# Patient Record
Sex: Male | Born: 1985 | Race: Black or African American | Hispanic: No | Marital: Single | Smoking: Current every day smoker
Health system: Southern US, Community
[De-identification: ages and names within clinical notes are randomized; demographics above are authoritative.]

---

## 2015-10-08 ENCOUNTER — Emergency Department (INDEPENDENT_AMBULATORY_CARE_PROVIDER_SITE_OTHER)
Admission: EM | Admit: 2015-10-08 | Discharge: 2015-10-08 | Disposition: A | Discharge status: Home / Self Care | Payer: Medicaid - Out of State | Source: Home / Self Care | Attending: Emergency Medicine | Admitting: Emergency Medicine

## 2015-10-08 ENCOUNTER — Encounter (HOSPITAL_COMMUNITY): Payer: Self-pay | Admitting: Emergency Medicine

## 2015-10-08 DIAGNOSIS — R030 Elevated blood-pressure reading, without diagnosis of hypertension: Secondary | ICD-10-CM

## 2015-10-08 DIAGNOSIS — IMO0001 Reserved for inherently not codable concepts without codable children: Secondary | ICD-10-CM

## 2015-10-08 NOTE — ED Notes (Signed)
Pt has recorded HBP from the plasma center over the last month.  Pt reports no significant medical or family history.

## 2015-10-08 NOTE — Discharge Instructions (Signed)
Hypertension Hypertension, commonly called high blood pressure, is when the force of blood pumping through your arteries is too strong. Your arteries are the blood vessels that carry blood from your heart throughout your body. A blood pressure reading consists of a higher number over a lower number, such as 110/72. The higher number (systolic) is the pressure inside your arteries when your heart pumps. The lower number (diastolic) is the pressure inside your arteries when your heart relaxes. Ideally you want your blood pressure below 120/80. Hypertension forces your heart to work harder to pump blood. Your arteries may become narrow or stiff. Having untreated or uncontrolled hypertension can cause heart attack, stroke, kidney disease, and other problems. RISK FACTORS Some risk factors for high blood pressure are controllable. Others are not.  Risk factors you cannot control include:   Race. You may be at higher risk if you are African American.  Age. Risk increases with age.  Gender. Men are at higher risk than women before age 45 years. After age 65, women are at higher risk than men. Risk factors you can control include:  Not getting enough exercise or physical activity.  Being overweight.  Getting too much fat, sugar, calories, or salt in your diet.  Drinking too much alcohol. SIGNS AND SYMPTOMS Hypertension does not usually cause signs or symptoms. Extremely high blood pressure (hypertensive crisis) may cause headache, anxiety, shortness of breath, and nosebleed. DIAGNOSIS To check if you have hypertension, your health care provider will measure your blood pressure while you are seated, with your arm held at the level of your heart. It should be measured at least twice using the same arm. Certain conditions can cause a difference in blood pressure between your right and left arms. A blood pressure reading that is higher than normal on one occasion does not mean that you need treatment. If  it is not clear whether you have high blood pressure, you may be asked to return on a different day to have your blood pressure checked again. Or, you may be asked to monitor your blood pressure at home for 1 or more weeks. TREATMENT Treating high blood pressure includes making lifestyle changes and possibly taking medicine. Living a healthy lifestyle can help lower high blood pressure. You may need to change some of your habits. Lifestyle changes may include:  Following the DASH diet. This diet is high in fruits, vegetables, and whole grains. It is low in salt, red meat, and added sugars.  Keep your sodium intake below 2,300 mg per day.  Getting at least 30-45 minutes of aerobic exercise at least 4 times per week.  Losing weight if necessary.  Not smoking.  Limiting alcoholic beverages.  Learning ways to reduce stress. Your health care provider may prescribe medicine if lifestyle changes are not enough to get your blood pressure under control, and if one of the following is true:  You are 18-59 years of age and your systolic blood pressure is above 140.  You are 60 years of age or older, and your systolic blood pressure is above 150.  Your diastolic blood pressure is above 90.  You have diabetes, and your systolic blood pressure is over 140 or your diastolic blood pressure is over 90.  You have kidney disease and your blood pressure is above 140/90.  You have heart disease and your blood pressure is above 140/90. Your personal target blood pressure may vary depending on your medical conditions, your age, and other factors. HOME CARE INSTRUCTIONS    Have your blood pressure rechecked as directed by your health care provider.   Take medicines only as directed by your health care provider. Follow the directions carefully. Blood pressure medicines must be taken as prescribed. The medicine does not work as well when you skip doses. Skipping doses also puts you at risk for  problems.  Do not smoke.   Monitor your blood pressure at home as directed by your health care provider. SEEK MEDICAL CARE IF:   You think you are having a reaction to medicines taken.  You have recurrent headaches or feel dizzy.  You have swelling in your ankles.  You have trouble with your vision. SEEK IMMEDIATE MEDICAL CARE IF:  You develop a severe headache or confusion.  You have unusual weakness, numbness, or feel faint.  You have severe chest or abdominal pain.  You vomit repeatedly.  You have trouble breathing. MAKE SURE YOU:   Understand these instructions.  Will watch your condition.  Will get help right away if you are not doing well or get worse.   This information is not intended to replace advice given to you by your health care provider. Make sure you discuss any questions you have with your health care provider.   Document Released: 07/26/2005 Document Revised: 12/10/2014 Document Reviewed: 05/18/2013 Elsevier Interactive Patient Education 2016 Else[employer] Inc[employer] Managing Your High Blood Pressure Blood pressure is a measurement of how forceful your blood is pressing against the walls of the arteries. Arteries are muscular tubes within the circulatory system. Blood pressure does not stay the same. Blood pressure rises when you are active, excited, or nervous; and it lowers during sleep and relaxation. If the numbers measuring your blood pressure stay above normal most of the time, you are at risk for health problems. High blood pressure (hypertension) is a long-term (chronic) condition in which blood pressure is elevated. A blood pressure reading is recorded as two numbers, such as 120 over 80 (or 120/80). The first, higher number is called the systolic pressure. It is a measure of the pressure in your arteries as the heart beats. The second, lower number is called the diastolic pressure. It is a measure of the pressure in your arteries as the heart relaxes between  beats.  Keeping your blood pressure in a normal range is important to your overall health and prevention of health problems, such as heart disease and stroke. When your blood pressure is uncontrolled, your heart has to work harder than normal. High blood pressure is a very common condition in adults because blood pressure tends to rise with age. Men and women are equally likely to have hypertension but at different times in life. Before age 45, men are more likely to have hypertension. After 32 years of age, women are more likely to have it. Hypertension is especially common in African Americans. This condition often has no signs or symptoms. The cause of the condition is usually not known. Your caregiver can help you come up with a plan to keep your blood pressure in a normal, healthy range. BLOOD PRESSURE STAGES Blood pressure is classified into four stages: normal, prehypertension, stage 1, and stage 2. Your blood pressure reading will be used to determine what type of treatment, if any, is necessary. Appropriate treatment options are tied to these four stages:  Normal  Systolic pressure (mm Hg): below 120.  Diastolic pressure (mm Hg): below 80. Prehypertension  Systolic pressure (mm Hg): 120 to 139.  Diastolic pressure (mm Hg): 80 to   89. Stage1  Systolic pressure (mm Hg): 140 to 159.  Diastolic pressure (mm Hg): 90 to 99. Stage2  Systolic pressure (mm Hg): 160 or above.  Diastolic pressure (mm Hg): 100 or above. RISKS RELATED TO HIGH BLOOD PRESSURE Managing your blood pressure is an important responsibility. Uncontrolled high blood pressure can lead to:  A heart attack.  A stroke.  A weakened blood vessel (aneurysm).  Heart failure.  Kidney damage.  Eye damage.  Metabolic syndrome.  Memory and concentration problems. HOW TO MANAGE YOUR BLOOD PRESSURE Blood pressure can be managed effectively with lifestyle changes and medicines (if needed). Your caregiver will help  you come up with a plan to bring your blood pressure within a normal range. Your plan should include the following: Education  Read all information provided by your caregivers about how to control blood pressure.  Educate yourself on the latest guidelines and treatment recommendations. New research is always being done to further define the risks and treatments for high blood pressure. Lifestylechanges  Control your weight.  Avoid smoking.  Stay physically active.  Reduce the amount of salt in your diet.  Reduce stress.  Control any chronic conditions, such as high cholesterol or diabetes.  Reduce your alcohol intake. Medicines  Several medicines (antihypertensive medicines) are available, if needed, to bring blood pressure within a normal range. Communication  Review all the medicines you take with your caregiver because there may be side effects or interactions.  Talk with your caregiver about your diet, exercise habits, and other lifestyle factors that may be contributing to high blood pressure.  See your caregiver regularly. Your caregiver can help you create and adjust your plan for managing high blood pressure. RECOMMENDATIONS FOR TREATMENT AND FOLLOW-UP  The following recommendations are based on current guidelines for managing high blood pressure in nonpregnant adults. Use these recommendations to identify the proper follow-up period or treatment option based on your blood pressure reading. You can discuss these options with your caregiver.  Systolic pressure of 120 to 139 or diastolic pressure of 80 to 89: Follow up with your caregiver as directed.  Systolic pressure of 140 to 160 or diastolic pressure of 90 to 100: Follow up with your caregiver within 2 months.  Systolic pressure above 160 or diastolic pressure above 100: Follow up with your caregiver within 1 month.  Systolic pressure above 180 or diastolic pressure above 110: Consider antihypertensive therapy;  follow up with your caregiver within 1 week.  Systolic pressure above 200 or diastolic pressure above 120: Begin antihypertensive therapy; follow up with your caregiver within 1 week.   This information is not intended to replace advice given to you by your health care provider. Make sure you discuss any questions you have with your health care provider.   Document Released: 04/19/2012 Document Reviewed: 04/19/2012 Elsevier Interactive Patient Education 2016 Elsevier Inc.  

## 2015-10-08 NOTE — ED Provider Notes (Signed)
CSN: [id number]     Arrival date & time 10/08/15  1315 History   First MD Initiated Contact with Patient 10/08/15 1442     Chief Complaint  Patient presents with  . Hypertension   (Consider location/radiation/quality/duration/timing/severity/associated sxs/prior Treatment) HPI Comments: 30 year old male presents to the urgent care for evaluation and management of hypertension. He apparently sells plasma for money and when he had his blood pressure readings taken on 2 separate dates his blood pressure were elevated. The readings given were 172/104, 142/104, 166/116 and 158/110. Patient states he is completely asymptomatic. His review of systems is negative. His current blood pressure in the urgent care is 141/95.   History reviewed. No pertinent past medical history. History reviewed. No pertinent past surgical history. History reviewed. No pertinent family history. Social History  Substance Use Topics  . Smoking status: Current Every Day Smoker -- 0.50 packs/day    Types: Cigarettes  . Smokeless tobacco: None  . Alcohol Use: Yes     Comment: occasional    Review of Systems  Constitutional: Negative.   HENT: Negative.   Respiratory: Negative.  Negative for cough and shortness of breath.   Cardiovascular: Negative for chest pain and leg swelling.  Gastrointestinal: Negative.   Musculoskeletal: Negative.   Skin: Negative.   Neurological: Negative.     Allergies  Review of patient's allergies indicates no known allergies.  Home Medications   Prior to Admission medications   Not on File   Meds Ordered and Administered this Visit  Medications - No data to display  BP 141/95 mmHg  Pulse 72  Temp(Src) 98.5 F (36.9 C) (Oral)  Resp 18  SpO2 99% No data found.   Physical Exam  Constitutional: He is oriented to person, place, and time. He appears well-developed and well-nourished. No distress.  Eyes: EOM are normal.  Neck: Normal range of motion. Neck supple.   Cardiovascular: Normal rate, regular rhythm and normal heart sounds.   Pulmonary/Chest: Effort normal and breath sounds normal. No respiratory distress. He has no wheezes.  Musculoskeletal: He exhibits no edema.  Neurological: He is alert and oriented to person, place, and time. He exhibits normal muscle tone.  Skin: Skin is warm and dry.  Psychiatric: He has a normal mood and affect.  Nursing note and vitals reviewed.   ED Course  Procedures (including critical care time)  Labs Review Labs Reviewed - No data to display  Imaging Review No results found.   Visual Acuity Review  Right Eye Distance:   Left Eye Distance:   Bilateral Distance:    Right Eye Near:   Left Eye Near:    Bilateral Near:         MDM   1. Elevated blood pressure    Patient is instructed to follow-up with  [facility] or call [phone] for assistance. He was also advised that this particular urgent care is unable to provide primary care.  He is completely asymptomatic. His vital signs are normal with mild elevation in blood pressure today. He is advised to stop smoking.    Hayden Rasmussen, NP 10/08/15 1535

## 2015-10-26 ENCOUNTER — Encounter (HOSPITAL_COMMUNITY): Payer: Self-pay | Admitting: Emergency Medicine

## 2015-10-26 ENCOUNTER — Emergency Department (HOSPITAL_COMMUNITY)
Admission: EM | Admit: 2015-10-26 | Discharge: 2015-10-26 | Disposition: A | Discharge status: Home / Self Care | Payer: Medicaid - Out of State | Attending: Emergency Medicine | Admitting: Emergency Medicine

## 2015-10-26 DIAGNOSIS — Y998 Other external cause status: Secondary | ICD-10-CM | POA: Diagnosis not present

## 2015-10-26 DIAGNOSIS — Y9289 Other specified places as the place of occurrence of the external cause: Secondary | ICD-10-CM | POA: Diagnosis not present

## 2015-10-26 DIAGNOSIS — S61012A Laceration without foreign body of left thumb without damage to nail, initial encounter: Secondary | ICD-10-CM | POA: Diagnosis not present

## 2015-10-26 DIAGNOSIS — Y9389 Activity, other specified: Secondary | ICD-10-CM | POA: Insufficient documentation

## 2015-10-26 DIAGNOSIS — X58XXXA Exposure to other specified factors, initial encounter: Secondary | ICD-10-CM | POA: Insufficient documentation

## 2015-10-26 DIAGNOSIS — F17210 Nicotine dependence, cigarettes, uncomplicated: Secondary | ICD-10-CM | POA: Insufficient documentation

## 2015-10-26 MED ORDER — LIDOCAINE HCL (PF) 1 % IJ SOLN
5.0000 mL | Freq: Once | INTRAMUSCULAR | Status: AC
  Administered 2015-10-26: 5 mL
  Filled 2015-10-26: qty 5

## 2015-10-26 MED ORDER — IBUPROFEN 400 MG PO TABS
800.0000 mg | ORAL_TABLET | Freq: Once | ORAL | Status: AC
  Administered 2015-10-26: 800 mg via ORAL
  Filled 2015-10-26: qty 2

## 2015-10-26 NOTE — ED Notes (Signed)
Pt has lac to left thumb unknown cause onset possible last night. No bleeding noted.

## 2015-10-26 NOTE — Discharge Instructions (Signed)
Return to the ER for fever, stitches break/wound reopens, purulent wound drainage, redness, increasing pain. Return to the ER or go to any urgent care in 10-14 days for suture removal. Ibuprofen as needed for pain.  Sutured Wound Care Sutures are stitches that can be used to close wounds. Taking care of your wound properly can help to prevent pain and infection. It can also help your wound to heal more quickly. HOW TO CARE FOR YOUR SUTURED WOUND Wound Care  Keep the wound clean and dry.  If you were given a bandage (dressing), you should change it at least once per day or as directed by your health care provider. You should also change it if it becomes wet or dirty.  Keep the wound completely dry for the first 24 hours or as directed by your health care provider. After that time, you may shower or bathe. However, make sure that the wound is not soaked in water until the sutures have been removed.  Clean the wound one time each day or as directed by your health care provider.  Wash the wound with soap and water.  Rinse the wound with water to remove all soap.  Pat the wound dry with a clean towel. Do not rub the wound.  Aftercleaning the wound, apply a thin layer of antibioticointment as directed by your health care provider. This will help to prevent infection and keep the dressing from sticking to the wound.  Have the sutures removed as directed by your health care provider. General Instructions  Take or apply medicines only as directed by your health care provider.  To help prevent scarring, make sure to cover your wound with sunscreen whenever you are outside after the sutures are removed and the wound is healed. Make sure to wear a sunscreen of at least 30 SPF.  If you were prescribed an antibiotic medicine or ointment, finish all of it even if you start to feel better.  Do not scratch or pick at the wound.  Keep all follow-up visits as directed by your health care provider.  This is important.  Check your wound every day for signs of infection. Watch for:   Redness, swelling, or pain.  Fluid, blood, or pus.  Raise (elevate) the injured area above the level of your heart while you are sitting or lying down, if possible.  Avoid stretching your wound.  Drink enough fluids to keep your urine clear or pale yellow. SEEK MEDICAL CARE IF:  You received a tetanus shot and you have swelling, severe pain, redness, or bleeding at the injection site.  You have a fever.  A wound that was closed breaks open.  You notice a bad smell coming from the wound.  You notice something coming out of the wound, such as wood or glass.  Your pain is not controlled with medicine.  You have increased redness, swelling, or pain at the site of your wound.  You have fluid, blood, or pus coming from your wound.  You notice a change in the color of your skin near your wound.  You need to change the dressing frequently due to fluid, blood, or pus draining from the wound.  You develop a new rash.  You develop numbness around the wound. SEEK IMMEDIATE MEDICAL CARE IF:  You develop severe swelling around the injury site.  Your pain suddenly increases and is severe.  You develop painful lumps near the wound or on skin that is anywhere on your body.  You  have a red streak going away from your wound.  The wound is on your hand or foot and you cannot properly move a finger or toe.  The wound is on your hand or foot and you notice that your fingers or toes look pale or bluish.   This information is not intended to replace advice given to you by your health care provider. Make sure you discuss any questions you have with your health care provider.   Document Released: 09/02/2004 Document Revised: 12/10/2014 Document Reviewed: 03/07/2013 Elsevier Interactive Patient Education [employer]

## 2015-10-26 NOTE — ED Provider Notes (Addendum)
CSN: [id number]     Arrival date & time 10/26/15  1212 History  By signing my name below, I, Adam Parks, attest that this documentation has been prepared under the direction and in the presence of Adam Elison Y. Nthony Lefferts, PA-C Electronically Signed: Soijett Parks, ED Scribe. 10/26/2015. 1:02 PM.   Chief Complaint  Patient presents with  . Extremity Laceration     The history is provided by the patient. No language interpreter was used.    Adam Parks is a 30 y.o. male who presents to the Emergency Department complaining of left thumb laceration onset 10 PM last night. Pt is unsure of how he cut herself but notes that he was intoxicated at the time. He is unsure if he is UTD on his tetanus. He states that he is having associated symptoms of numbness to left thumb. He states that he has tried wound care with peroxide for the relief for his symptoms. He denies color change, swelling, tingling, and any other symptoms at this time.   History reviewed. No pertinent past medical history. History reviewed. No pertinent past surgical history. No family history on file. Social History  Substance Use Topics  . Smoking status: Current Every Day Smoker -- 0.50 packs/day    Types: Cigarettes  . Smokeless tobacco: None  . Alcohol Use: Yes     Comment: occasional    Review of Systems  Musculoskeletal: Positive for arthralgias. Negative for joint swelling.  Skin: Positive for wound. Negative for color change and rash.  All other systems reviewed and are negative.     Allergies  Review of patient's allergies indicates no known allergies.  Home Medications   Prior to Admission medications   Not on File   BP 128/85 mmHg  Pulse 95  Temp(Src) 98.7 F (37.1 C) (Oral)  Resp 18  Ht 5\' 6"  (1.676 m)  Wt 175 lb 8 oz (79.606 kg)  BMI 28.34 kg/m2  SpO2 99% Physical Exam  Constitutional: He is oriented to person, place, and time. He appears well-developed and well-nourished. No distress.  HENT:   Head: Normocephalic and atraumatic.  Eyes: EOM are normal.  Neck: Neck supple.  Cardiovascular: Normal rate.   Pulmonary/Chest: Effort normal. No respiratory distress.  Musculoskeletal: Normal range of motion.  Left thumb with 1.5 cm very superificial laceration that runs diagonally across 1st knuckle. FROM of finger. Brisk cap refill. Intact sensation and strength.   Neurological: He is alert and oriented to person, place, and time.  Skin: Skin is warm and dry.  Psychiatric: He has a normal mood and affect. His behavior is normal.  Nursing note and vitals reviewed.   ED Course  Procedures (including critical care time)  NERVE BLOCK Performed by: Noelle PennerSerena Kaina Orengo Consent: Verbal consent obtained. Required items: required blood products, implants, devices, and special equipment available Time out: Immediately prior to procedure a "time out" was called to verify the correct patient, procedure, equipment, support staff and site/side marked as required.  Indication: thumb lac Nerve block body site: left thumb  Preparation: Patient was prepped and draped in the usual sterile fashion. Needle gauge: 24 G Location technique: anatomical landmarks  Local anesthetic: 1% lido without epi  Anesthetic total: 4 ml  Outcome: pain improved Patient tolerance: Patient tolerated the procedure well with no immediate complications.  LACERATION REPAIR Performed by: Noelle PennerSerena Johnie Stadel Authorized by: Noelle PennerSerena Kennth Vanbenschoten Consent: Verbal consent obtained. Risks and benefits: risks, benefits and alternatives were discussed Consent given by: patient Patient identity confirmed: provided demographic data  Prepped and Draped in normal sterile fashion Wound explored  Laceration Location: left thumb  Laceration Length: 1.5cm  No Foreign Bodies seen or palpated  Anesthesia: local infiltration  Local anesthetic: lidocaine 1% without epinephrine  Anesthetic total: 4 ml  Irrigation method: syringe Amount of cleaning:  standard  Skin closure: 5-0 prolene   Number of sutures: 3  Technique: simple interrupted  Patient tolerance: Patient tolerated the procedure well with no immediate complications.   DIAGNOSTIC STUDIES: Oxygen Saturation is 99% on RA, nl by my interpretation.    COORDINATION OF CARE: 12:58 PM Discussed treatment plan with pt at bedside which includes laceration repair and pt agreed to plan.    Labs Review Labs Reviewed - No data to display  Imaging Review No results found.    EKG Interpretation None      MDM   Final diagnoses:  Laceration of left thumb, initial encounter    Pt tolerated lac repair well. He is declining x-ray today. Neurovascularly intact. Lac is superficial, and i do not see any bony involvement. Patient may be safely discharged home. Discussed reasons for return (fever, stitches break/wound reopens, purulent wound drainage, redness, increasing pain). Patient to follow-up with primary care provider within 10-14 days for suture removal. Patient in understanding and agreement with the plan.  Pt witnessed leaving the ED by staff member before receiving discharge paperwork.  I personally performed the services described in this documentation, which was scribed in my presence. The recorded information has been reviewed and is accurate.   Carlene Coria, PA-C 10/26/15 1406  Glynn Octave, MD 10/26/15 1516  Carlene Coria, PA-C 10/31/15 1256  Glynn Octave, MD 11/02/15 [phone]

## 2020-10-19 ENCOUNTER — Encounter: Payer: Self-pay | Admitting: Emergency Medicine

## 2020-10-19 ENCOUNTER — Emergency Department: Payer: Commercial Managed Care - PPO

## 2020-10-19 ENCOUNTER — Other Ambulatory Visit: Payer: Self-pay

## 2020-10-19 ENCOUNTER — Emergency Department
Admission: EM | Admit: 2020-10-19 | Discharge: 2020-10-19 | Disposition: A | Discharge status: Home / Self Care | Payer: Commercial Managed Care - PPO | Attending: Emergency Medicine | Admitting: Emergency Medicine

## 2020-10-19 DIAGNOSIS — Z20822 Contact with and (suspected) exposure to covid-19: Secondary | ICD-10-CM | POA: Diagnosis not present

## 2020-10-19 DIAGNOSIS — E860 Dehydration: Secondary | ICD-10-CM | POA: Insufficient documentation

## 2020-10-19 DIAGNOSIS — J101 Influenza due to other identified influenza virus with other respiratory manifestations: Secondary | ICD-10-CM | POA: Diagnosis not present

## 2020-10-19 DIAGNOSIS — R0781 Pleurodynia: Secondary | ICD-10-CM | POA: Insufficient documentation

## 2020-10-19 DIAGNOSIS — R059 Cough, unspecified: Secondary | ICD-10-CM | POA: Diagnosis present

## 2020-10-19 DIAGNOSIS — F17210 Nicotine dependence, cigarettes, uncomplicated: Secondary | ICD-10-CM | POA: Insufficient documentation

## 2020-10-19 LAB — COMPREHENSIVE METABOLIC PANEL
ALT: 55 U/L — ABNORMAL HIGH (ref 0–44)
AST: 49 U/L — ABNORMAL HIGH (ref 15–41)
Albumin: 4.3 g/dL (ref 3.5–5.0)
Alkaline Phosphatase: 62 U/L (ref 38–126)
Anion gap: 11 (ref 5–15)
BUN: 15 mg/dL (ref 6–20)
CO2: 26 mmol/L (ref 22–32)
Calcium: 9.1 mg/dL (ref 8.9–10.3)
Chloride: 97 mmol/L — ABNORMAL LOW (ref 98–111)
Creatinine, Ser: 1.28 mg/dL — ABNORMAL HIGH (ref 0.61–1.24)
GFR, Estimated: >60 mL/min (ref >60)
Glucose, Bld: 102 mg/dL — ABNORMAL HIGH (ref 70–99)
Potassium: 3.2 mmol/L — ABNORMAL LOW (ref 3.5–5.1)
Sodium: 134 mmol/L — ABNORMAL LOW (ref 135–145)
Total Bilirubin: 0.6 mg/dL (ref 0.3–1.2)
Total Protein: 7.7 g/dL (ref 6.5–8.1)

## 2020-10-19 LAB — D-DIMER, QUANTITATIVE: D-Dimer, Quant: 0.68 ug/mL-FEU — ABNORMAL HIGH (ref 0.00–0.50)

## 2020-10-19 LAB — CBC WITH DIFFERENTIAL/PLATELET
Abs Immature Granulocytes: 0.03 10*3/uL (ref 0.00–0.07)
Basophils Absolute: 0 10*3/uL (ref 0.0–0.1)
Basophils Relative: 1 %
Eosinophils Absolute: 0 10*3/uL (ref 0.0–0.5)
Eosinophils Relative: 0 %
HCT: 43.8 % (ref 39.0–52.0)
Hemoglobin: 14.7 g/dL (ref 13.0–17.0)
Immature Granulocytes: 1 %
Lymphocytes Relative: 14 %
Lymphs Abs: 0.8 10*3/uL (ref 0.7–4.0)
MCH: 30.8 pg (ref 26.0–34.0)
MCHC: 33.6 g/dL (ref 30.0–36.0)
MCV: 91.6 fL (ref 80.0–100.0)
Monocytes Absolute: 0.4 10*3/uL (ref 0.1–1.0)
Monocytes Relative: 7 %
Neutro Abs: 4.7 10*3/uL (ref 1.7–7.7)
Neutrophils Relative %: 77 %
Platelets: 310 10*3/uL (ref 150–400)
RBC: 4.78 MIL/uL (ref 4.22–5.81)
RDW: 12.5 % (ref 11.5–15.5)
WBC: 6 10*3/uL (ref 4.0–10.5)
nRBC: 0 % (ref 0.0–0.2)

## 2020-10-19 LAB — TROPONIN I (HIGH SENSITIVITY)
Troponin I (High Sensitivity): 15 ng/L (ref <18)
Troponin I (High Sensitivity): 15 ng/L (ref <18)

## 2020-10-19 LAB — RESP PANEL BY RT-PCR (FLU A&B, COVID) ARPGX2
Influenza A by PCR: POSITIVE — AB
Influenza B by PCR: NEGATIVE
SARS Coronavirus 2 by RT PCR: NEGATIVE

## 2020-10-19 IMAGING — CT CT ANGIO CHEST
2 of 6 series · 19 of 46 positions shown · IV contrast (APPLIED)
Comparison: None.

CLINICAL DATA: Cough and fever.

EXAM:
CT ANGIOGRAPHY CHEST WITH CONTRAST
TECHNIQUE: Multidetector CT imaging of the chest was performed using the
standard protocol during bolus administration of intravenous
contrast. Multiplanar CT image reconstructions and MIPs were
obtained to evaluate the vascular anatomy.
CONTRAST:  75mL OMNIPAQUE IOHEXOL 350 MG/ML SOLN

[Series 5: thins · axial · 0.62mm/px · z∈[-219,+66]mm · 16 of 313 slices shown]
[im 14/313  lung]
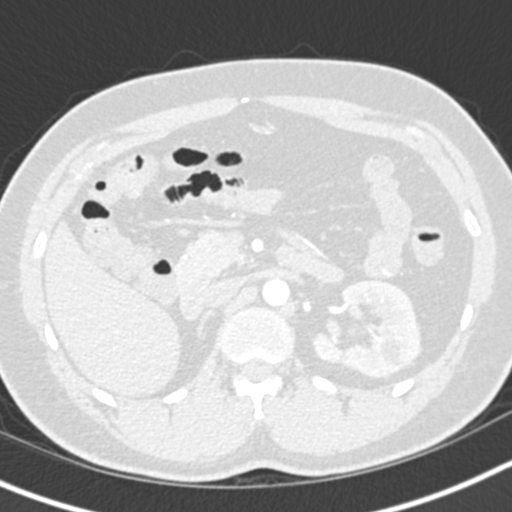
[im 41/313  soft-tissue]
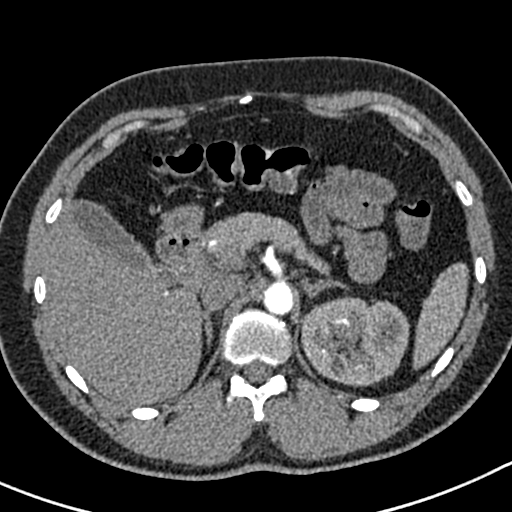
[im 55/313  lung]
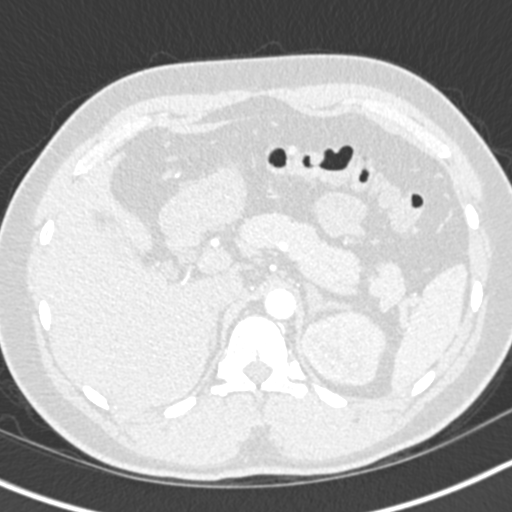
[im 68/313  soft-tissue]
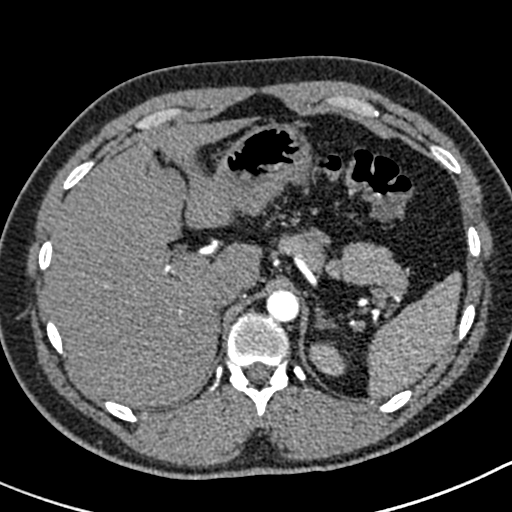
[im 95/313  lung]
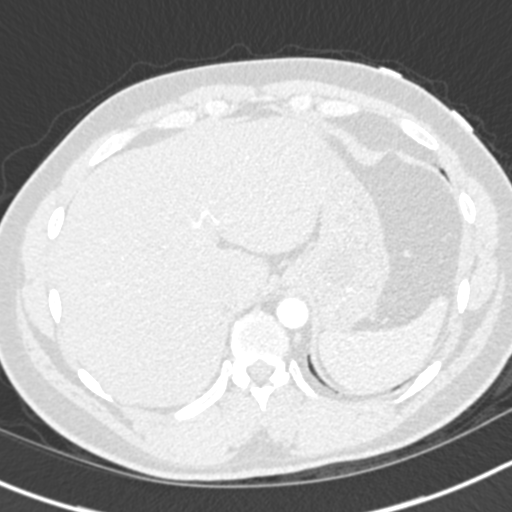
[im 109/313  soft-tissue]
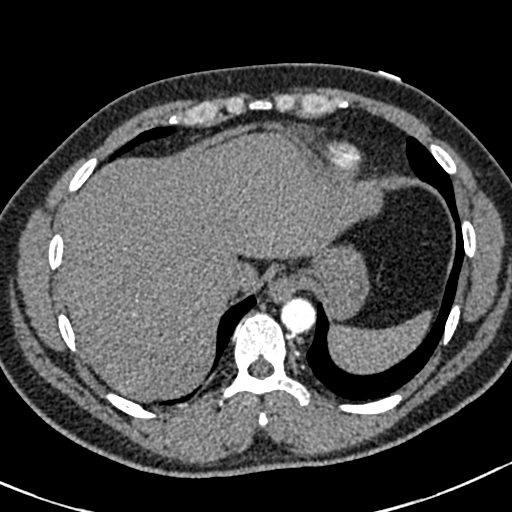
[im 123/313  lung]
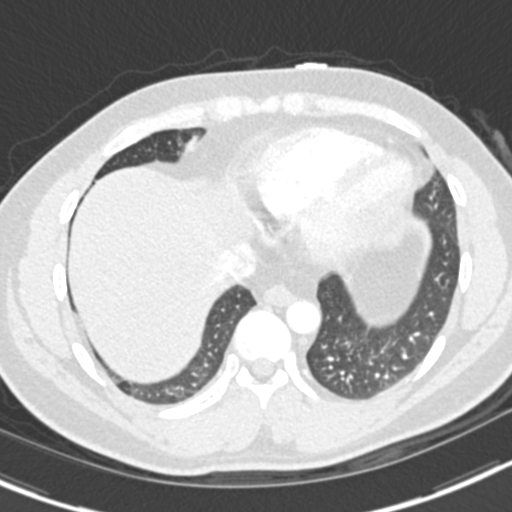
[im 150/313  soft-tissue]
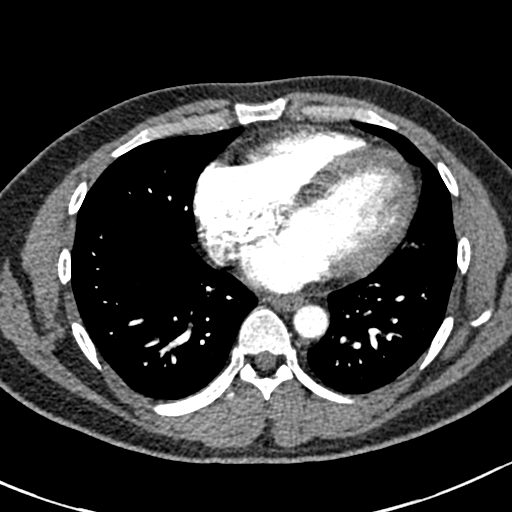
[im 163/313  lung]
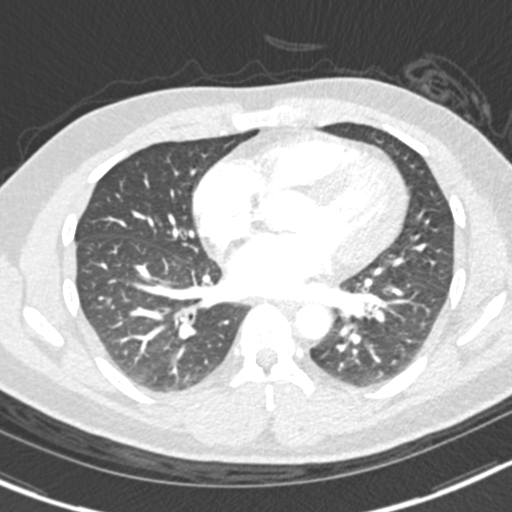
[im 190/313  soft-tissue]
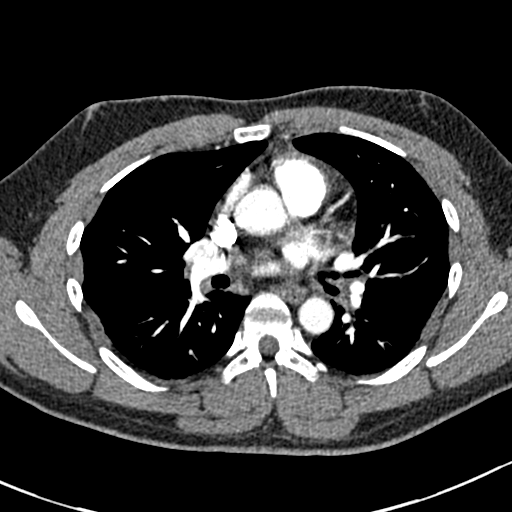
[im 204/313  lung]
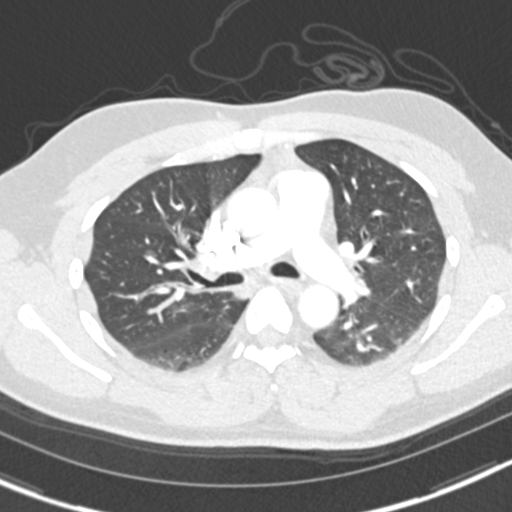
[im 218/313  soft-tissue]
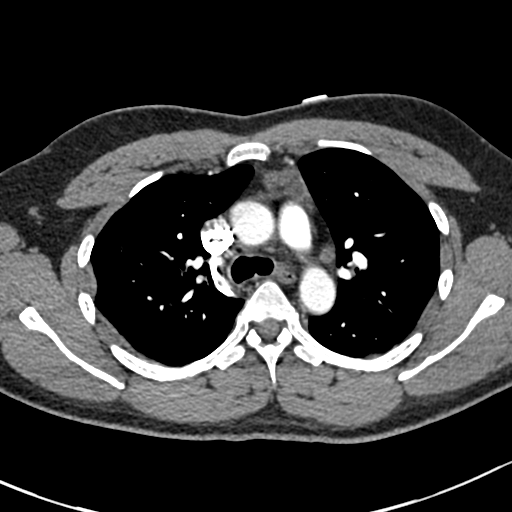
[im 245/313  lung]
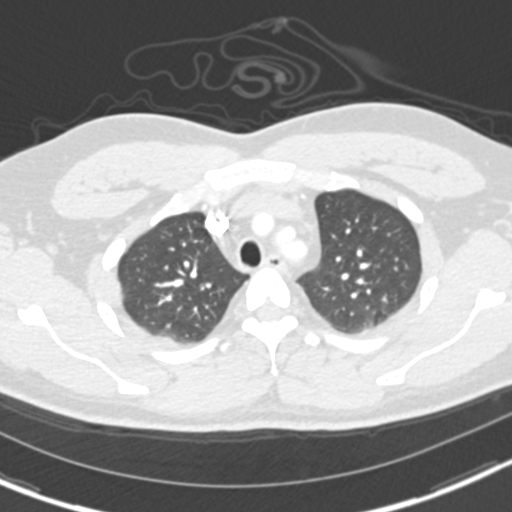
[im 258/313  soft-tissue]
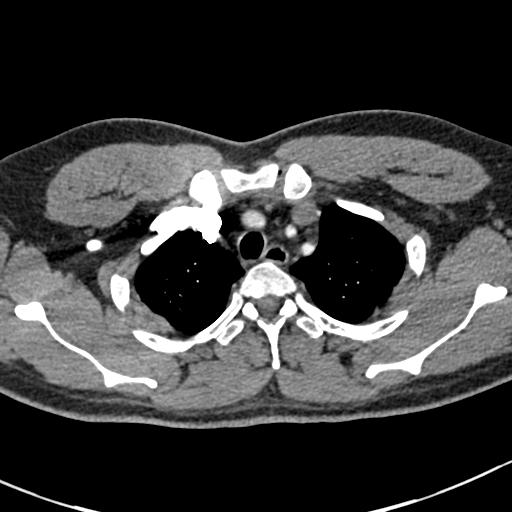
[im 272/313  lung]
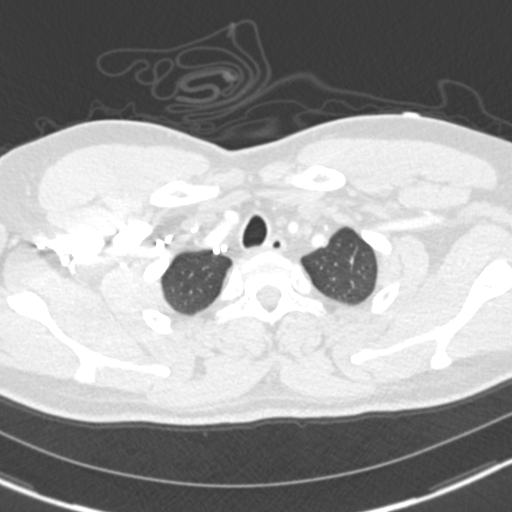
[im 299/313  soft-tissue]
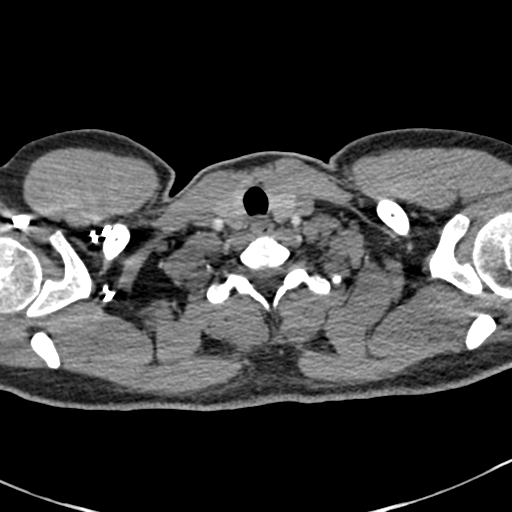

[Series 7: coronal mpr · coronal · 0.60mm/px · 3 of 80 slices shown]
[im 20/80  soft-tissue]
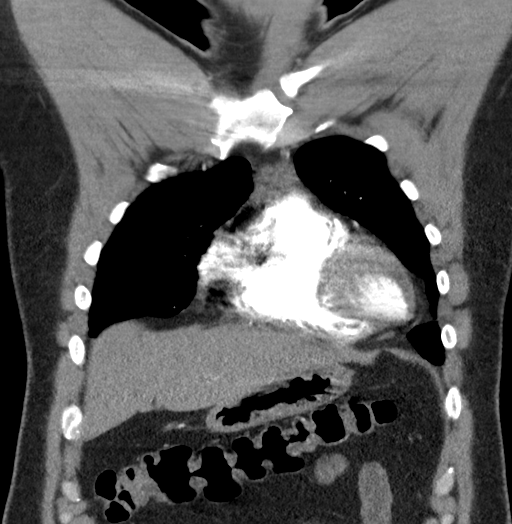
[im 40/80  soft-tissue]
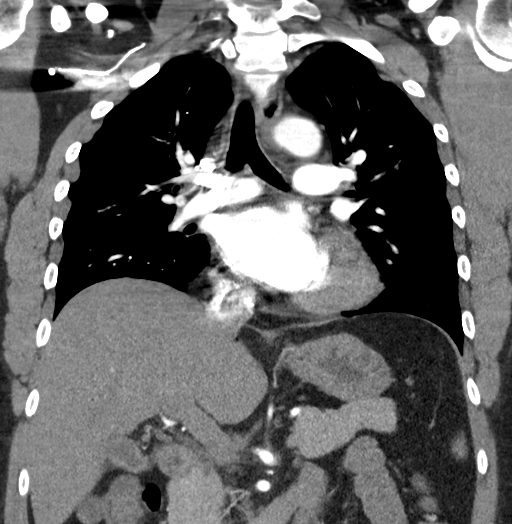
[im 60/80  soft-tissue]
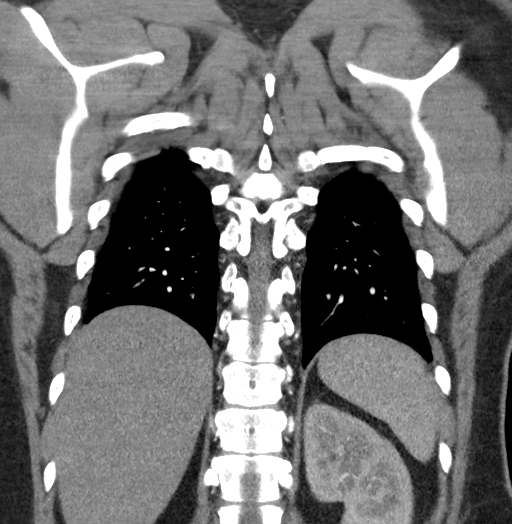

[19 of 46 positions shown; findings below may reference images not displayed]

FINDINGS: Cardiovascular: The thoracic aorta is normal in appearance.
Satisfactory opacification of the pulmonary arteries to the
segmental level. No evidence of pulmonary embolism. Normal heart
size. No pericardial effusion.

Mediastinum/Nodes: There is mild right hilar lymphadenopathy.
Thyroid gland, trachea, and esophagus demonstrate no significant
findings.

Lungs/Pleura: Trace amount of atelectasis and/or early infiltrate is
seen within the lateral and posterolateral aspects of the right

There is no evidence of a pleural effusion or pneumothorax.

Upper Abdomen: 0.8 cm, 1.2 cm and 1.1 cm cysts are seen within the
left kidney.

Musculoskeletal: No chest wall abnormality. No acute or significant
osseous findings.

Review of the MIP images confirms the above findings.
IMPRESSION: 1. Trace amount of right upper lobe atelectasis and/or early
infiltrate.
2. Mild right hilar lymphadenopathy, likely reactive.

## 2020-10-19 MED ORDER — IOHEXOL 350 MG/ML SOLN
75.0000 mL | Freq: Once | INTRAVENOUS | Status: AC | PRN
  Administered 2020-10-19: 75 mL via INTRAVENOUS
  Filled 2020-10-19: qty 75

## 2020-10-19 MED ORDER — SODIUM CHLORIDE 0.9 % IV BOLUS
1000.0000 mL | Freq: Once | INTRAVENOUS | Status: AC
  Administered 2020-10-19: 1000 mL via INTRAVENOUS

## 2020-10-19 MED ORDER — ACETAMINOPHEN 325 MG PO TABS
650.0000 mg | ORAL_TABLET | Freq: Once | ORAL | Status: AC
  Administered 2020-10-19: 650 mg via ORAL
  Filled 2020-10-19: qty 2

## 2020-10-19 MED ORDER — POTASSIUM CHLORIDE CRYS ER 20 MEQ PO TBCR
40.0000 meq | EXTENDED_RELEASE_TABLET | Freq: Once | ORAL | Status: AC
  Administered 2020-10-19: 40 meq via ORAL
  Filled 2020-10-19: qty 2

## 2020-10-19 NOTE — ED Triage Notes (Signed)
Pt states symptoms started 2 days ago, cough, generalized body aches, fever, HA. Pt states son recently dx with the flu.

## 2020-10-19 NOTE — ED Provider Notes (Signed)
[facility] Emergency Department Provider Note  ____________________________________________   Event Date/Time   First MD Initiated Contact with Patient 10/19/20 1645     (approximate)  I have reviewed the triage vital signs and the nursing notes.   HISTORY  Chief Complaint Cough  HPI Adam Parks is a 35 y.o. male who reports to the emergency department for evaluation of cough, body aches, fever, headache and diarrhea that started 2 days ago.  Patient' son was recently seen in our facility 3 to 4 days ago and was diagnosed with influenza.  Patient states that he tried to stay away from the son after diagnosis, but was exposed prior to this.  He reports associated pleuritic chest pain on the right lateral side when he coughs and with deep breathing.  Denies chest pain at rest.  He denies vomiting, abdominal pain, dysuria, back pain.  Does not have much of an appetite, but has tried to stay hydrated by drinking water.  Patient reports he overall does not feel well.       History reviewed. No pertinent past medical history.  There are no problems to display for this patient.   History reviewed. No pertinent surgical history.  Prior to Admission medications   Not on File    Allergies Patient has no known allergies.  History reviewed. No pertinent family history.  Social History Social History   Tobacco Use  . Smoking status: Current Every Day Smoker    Packs/day: 0.50    Types: Cigarettes  Substance Use Topics  . Alcohol use: Yes    Comment: occasional  . Drug use: No    Review of Systems Constitutional: + fever/chills Eyes: No visual changes. ENT: No sore throat. Cardiovascular: + chest pain. Respiratory: + Cough, denies shortness of breath. Gastrointestinal: No abdominal pain.  No nausea, no vomiting.  + diarrhea.  No constipation. Genitourinary: Negative for dysuria. Musculoskeletal: Negative for back pain. Skin: Negative for  rash. Neurological: Negative for headaches, focal weakness or numbness.  ____________________________________________   PHYSICAL EXAM:  VITAL SIGNS: ED Triage Vitals [10/19/20 1618]  Enc Vitals Group     BP 134/83     Pulse Rate (!) 107     Resp 20     Temp 99.9 F (37.7 C)     Temp Source Oral     SpO2 97 %     Weight 160 lb (72.6 kg)     Height 5\' 6"  (1.676 m)     Head Circumference      Peak Flow      Pain Score 7     Pain Loc      Pain Edu?      Excl. in GC?    Constitutional: Alert and oriented.  Toxic appearing but in no acute distress. Eyes: Conjunctivae are normal. PERRL. EOMI. Head: Atraumatic. Nose: No congestion/rhinnorhea. Mouth/Throat: Mucous membranes are moist.  Oropharynx non-erythematous. Neck: No stridor.   Cardiovascular: Tachycardic, regular rhythm. Grossly normal heart sounds.  Good peripheral circulation. Respiratory: Normal respiratory effort.  No retractions. Lungs CTAB. Gastrointestinal: Soft and nontender. No distention. No abdominal bruits. No CVA tenderness. Musculoskeletal: No lower extremity tenderness nor edema.  No joint effusions. Neurologic:  Normal speech and language. No gross focal neurologic deficits are appreciated. No gait instability. Skin:  Skin is warm, dry and intact. No rash noted. Psychiatric: Mood and affect are normal. Speech and behavior are normal.  ____________________________________________   LABS (all labs ordered are listed, but only  abnormal results are displayed)  Labs Reviewed  RESP PANEL BY RT-PCR (FLU A&B, COVID) ARPGX2 - Abnormal; Notable for the following components:      Result Value   Influenza A by PCR POSITIVE (*)    All other components within normal limits  COMPREHENSIVE METABOLIC PANEL - Abnormal; Notable for the following components:   Sodium 134 (*)    Potassium 3.2 (*)    Chloride 97 (*)    Glucose, Bld 102 (*)    Creatinine, Ser 1.28 (*)    AST 49 (*)    ALT 55 (*)    All other  components within normal limits  D-DIMER, QUANTITATIVE - Abnormal; Notable for the following components:   D-Dimer, Quant 0.68 (*)    All other components within normal limits  CBC WITH DIFFERENTIAL/PLATELET  TROPONIN I (HIGH SENSITIVITY)  TROPONIN I (HIGH SENSITIVITY)   ____________________________________________  EKG  EKG reveals normal sinus rhythm with a rate of 88 bpm, no ST elevations or depressions, intermittent T wave inversions nonspecific.  No evidence of acute ischemia. ____________________________________________  RADIOLOGY I, Lucy Chris, personally viewed and evaluated these images (plain radiographs) as part of my medical decision making, as well as reviewing the written report by the radiologist.  ED provider interpretation: X-ray of the chest does not reveal any acute focal infiltrate, see radiology report for CT findings  Official radiology report(s):   ____________________________________________   INITIAL IMPRESSION / ASSESSMENT AND PLAN / ED COURSE  As part of my medical decision making, I reviewed the following data within the electronic MEDICAL RECORD NUMBER Nursing notes reviewed and incorporated, Labs reviewed, Radiograph reviewed and Notes from prior ED visits        Patient is a 35 year old male who presents to the emergency department for evaluation of acute illness over the last 2 to 3 days after known influenza exposure.  No significant symptoms include cough, pleuritic chest pain, fever and diarrhea.  See HPI for further details.  In triage, the patient has a temperature of 99.9, is tachycardic at 107, otherwise vitals are within normal limits.  He overall is toxic appearing, but is not in acute distress.  Physical exam does not reveal any significant acute findings on auscultation or abdominal exam.  Given the patient's overall appearance as well as significant amount of diarrhea, initial work-up included chest x-ray, CMP, CBC and respiratory  panel.  IV was also placed for fluid hydration.  Patient did test positive for influenza A.  CBC is within normal limits.  Troponin is 15.  EKG without evidence of acute ischemia.  CMP reveals a sodium of 134, potassium 3.2, creatinine 1.28 and mild elevation in AST and ALT.  These likely reflect dehydration secondary to diarrhea from the flu.  Patient did receive 1 L of normal saline as well as potassium replacement.  Given the patient's tachycardiac and pleuritic chest pain, without other significant risk factors, D-dimer was obtained and was elevated at 0.68.  A CT was then obtained to rule out PE, and was negative.  At this time, patient is overall appearing better after IV fluid hydration.  He appears stable to go home.  Discussed the findings with him and encouraged significant hydration at home, preferably with electrolyte drinks.  Patient is amenable with this plan, strict return precautions were discussed and he is amenable.  Patient is appropriate this time for outpatient follow-up.      ____________________________________________   FINAL CLINICAL IMPRESSION(S) / ED DIAGNOSES  Final diagnoses:  Influenza A  Dehydration     ED Discharge Orders    None      *Please note:  Chastin Riesgo was evaluated in Emergency Department on 10/20/2020 for the symptoms described in the history of present illness. He was evaluated in the context of the global COVID-19 pandemic, which necessitated consideration that the patient might be at risk for infection with the SARS-CoV-2 virus that causes COVID-19. Institutional protocols and algorithms that pertain to the evaluation of patients at risk for COVID-19 are in a state of rapid change based on information released by regulatory bodies including the CDC and federal and state organizations. These policies and algorithms were followed during the patient's care in the ED.  Some ED evaluations and interventions may be delayed as a result of limited  staffing during and the pandemic.*   Note:  This document was prepared using Dragon voice recognition software and may include unintentional dictation errors.   Lucy Chris, PA 10/20/20 Waunita Schooner    Phineas Semen, MD 10/28/20 1728

## 2020-10-19 NOTE — Discharge Instructions (Signed)
Hydrate with electrolytes as much as possible. Follow up with Primary Care. Return to ER with any worsening.
